# Patient Record
Sex: Female | Born: 1970 | Race: White | Hispanic: No | Marital: Married | Smoking: Former smoker
Health system: Southern US, Community
[De-identification: ages and names within clinical notes are randomized; demographics above are authoritative.]

## PROBLEM LIST (undated history)

## (undated) HISTORY — PX: CYSTOSCOPY WITH URETEROSCOPY AND STENT PLACEMENT: SHX6377

---

## 1998-07-11 ENCOUNTER — Ambulatory Visit (HOSPITAL_COMMUNITY): Admission: RE | Admit: 1998-07-11 | Discharge: 1998-07-11 | Payer: Self-pay | Admitting: Family Medicine

## 1998-07-11 ENCOUNTER — Encounter: Payer: Self-pay | Admitting: Family Medicine

## 1998-10-27 ENCOUNTER — Other Ambulatory Visit: Admission: RE | Admit: 1998-10-27 | Discharge: 1998-10-27 | Payer: Self-pay | Admitting: Obstetrics and Gynecology

## 1999-03-24 ENCOUNTER — Encounter: Payer: Self-pay | Admitting: Family Medicine

## 1999-03-24 ENCOUNTER — Ambulatory Visit (HOSPITAL_COMMUNITY): Admission: RE | Admit: 1999-03-24 | Discharge: 1999-03-24 | Payer: Self-pay | Admitting: Family Medicine

## 1999-03-28 ENCOUNTER — Ambulatory Visit (HOSPITAL_COMMUNITY): Admission: RE | Admit: 1999-03-28 | Discharge: 1999-03-28 | Payer: Self-pay

## 1999-03-28 ENCOUNTER — Encounter: Payer: Self-pay | Admitting: Family Medicine

## 1999-12-03 ENCOUNTER — Other Ambulatory Visit: Admission: RE | Admit: 1999-12-03 | Discharge: 1999-12-03 | Payer: Self-pay | Admitting: Obstetrics and Gynecology

## 1999-12-22 ENCOUNTER — Ambulatory Visit (HOSPITAL_COMMUNITY): Admission: RE | Admit: 1999-12-22 | Discharge: 1999-12-22 | Payer: Self-pay | Admitting: Internal Medicine

## 1999-12-22 ENCOUNTER — Encounter: Payer: Self-pay | Admitting: Internal Medicine

## 2000-05-03 ENCOUNTER — Encounter: Admission: RE | Admit: 2000-05-03 | Discharge: 2000-08-01 | Payer: Self-pay | Admitting: Emergency Medicine

## 2001-06-14 ENCOUNTER — Other Ambulatory Visit: Admission: RE | Admit: 2001-06-14 | Discharge: 2001-06-14 | Payer: Self-pay | Admitting: Obstetrics and Gynecology

## 2002-02-10 ENCOUNTER — Encounter: Payer: Self-pay | Admitting: Emergency Medicine

## 2002-02-10 ENCOUNTER — Emergency Department (HOSPITAL_COMMUNITY): Admission: EM | Admit: 2002-02-10 | Discharge: 2002-02-11 | Payer: Self-pay | Admitting: Emergency Medicine

## 2003-01-16 ENCOUNTER — Other Ambulatory Visit: Admission: RE | Admit: 2003-01-16 | Discharge: 2003-01-16 | Payer: Self-pay | Admitting: Obstetrics and Gynecology

## 2004-05-25 ENCOUNTER — Ambulatory Visit: Payer: Self-pay | Admitting: Adult Health

## 2004-11-19 ENCOUNTER — Emergency Department (HOSPITAL_COMMUNITY): Admission: EM | Admit: 2004-11-19 | Discharge: 2004-11-20 | Payer: Self-pay | Admitting: Emergency Medicine

## 2004-11-27 ENCOUNTER — Ambulatory Visit: Payer: Self-pay | Admitting: Adult Health

## 2005-07-30 ENCOUNTER — Ambulatory Visit: Payer: Self-pay | Admitting: Pulmonary Disease

## 2005-10-11 ENCOUNTER — Ambulatory Visit: Payer: Self-pay | Admitting: Pulmonary Disease

## 2005-10-25 ENCOUNTER — Ambulatory Visit: Payer: Self-pay | Admitting: Pulmonary Disease

## 2005-11-12 ENCOUNTER — Ambulatory Visit: Payer: Self-pay | Admitting: Pulmonary Disease

## 2006-01-24 ENCOUNTER — Encounter: Payer: Self-pay | Admitting: Cardiology

## 2006-01-24 ENCOUNTER — Ambulatory Visit: Payer: Self-pay | Admitting: Cardiovascular Disease

## 2006-01-24 ENCOUNTER — Ambulatory Visit: Payer: Self-pay

## 2006-07-12 ENCOUNTER — Inpatient Hospital Stay (HOSPITAL_COMMUNITY): Admission: RE | Admit: 2006-07-12 | Discharge: 2006-07-14 | Payer: Self-pay | Admitting: Obstetrics and Gynecology

## 2006-07-12 ENCOUNTER — Encounter (INDEPENDENT_AMBULATORY_CARE_PROVIDER_SITE_OTHER): Payer: Self-pay | Admitting: *Deleted

## 2006-07-20 ENCOUNTER — Ambulatory Visit (HOSPITAL_COMMUNITY): Admission: RE | Admit: 2006-07-20 | Discharge: 2006-07-20 | Payer: Self-pay | Admitting: Obstetrics and Gynecology

## 2006-11-08 ENCOUNTER — Ambulatory Visit: Payer: Self-pay | Admitting: Pulmonary Disease

## 2007-02-10 ENCOUNTER — Ambulatory Visit: Payer: Self-pay | Admitting: Internal Medicine

## 2007-02-10 LAB — CONVERTED CEMR LAB
Basophils Absolute: 0.1 10*3/uL (ref 0.0–0.1)
Basophils Relative: 0.8 % (ref 0.0–1.0)
Eosinophils Relative: 1.4 % (ref 0.0–5.0)
HCT: 37.4 % (ref 36.0–46.0)
Hemoglobin: 12.7 g/dL (ref 12.0–15.0)
MCHC: 34 g/dL (ref 30.0–36.0)
MCV: 83.8 fL (ref 78.0–100.0)
Platelets: 539 10*3/uL — ABNORMAL HIGH (ref 150–400)
RBC: 4.46 M/uL (ref 3.87–5.11)
RDW: 12 % (ref 11.5–14.6)

## 2007-02-14 ENCOUNTER — Ambulatory Visit: Payer: Self-pay | Admitting: Internal Medicine

## 2007-02-14 ENCOUNTER — Encounter: Payer: Self-pay | Admitting: Internal Medicine

## 2007-08-10 ENCOUNTER — Telehealth (INDEPENDENT_AMBULATORY_CARE_PROVIDER_SITE_OTHER): Payer: Self-pay | Admitting: *Deleted

## 2007-08-10 DIAGNOSIS — F988 Other specified behavioral and emotional disorders with onset usually occurring in childhood and adolescence: Secondary | ICD-10-CM | POA: Insufficient documentation

## 2007-08-10 DIAGNOSIS — K589 Irritable bowel syndrome without diarrhea: Secondary | ICD-10-CM | POA: Insufficient documentation

## 2007-08-11 ENCOUNTER — Ambulatory Visit: Payer: Self-pay | Admitting: Pulmonary Disease

## 2007-08-11 DIAGNOSIS — J069 Acute upper respiratory infection, unspecified: Secondary | ICD-10-CM | POA: Insufficient documentation

## 2007-08-11 DIAGNOSIS — J019 Acute sinusitis, unspecified: Secondary | ICD-10-CM

## 2007-08-12 DIAGNOSIS — F329 Major depressive disorder, single episode, unspecified: Secondary | ICD-10-CM

## 2007-08-12 DIAGNOSIS — R0789 Other chest pain: Secondary | ICD-10-CM | POA: Insufficient documentation

## 2007-08-12 DIAGNOSIS — N200 Calculus of kidney: Secondary | ICD-10-CM | POA: Insufficient documentation

## 2007-08-12 DIAGNOSIS — IMO0002 Reserved for concepts with insufficient information to code with codable children: Secondary | ICD-10-CM | POA: Insufficient documentation

## 2007-09-08 ENCOUNTER — Telehealth (INDEPENDENT_AMBULATORY_CARE_PROVIDER_SITE_OTHER): Payer: Self-pay | Admitting: *Deleted

## 2007-10-03 ENCOUNTER — Encounter: Payer: Self-pay | Admitting: Pulmonary Disease

## 2007-10-04 DIAGNOSIS — D126 Benign neoplasm of colon, unspecified: Secondary | ICD-10-CM

## 2007-10-04 DIAGNOSIS — K5900 Constipation, unspecified: Secondary | ICD-10-CM | POA: Insufficient documentation

## 2007-11-27 ENCOUNTER — Telehealth (INDEPENDENT_AMBULATORY_CARE_PROVIDER_SITE_OTHER): Payer: Self-pay | Admitting: *Deleted

## 2007-11-28 ENCOUNTER — Telehealth (INDEPENDENT_AMBULATORY_CARE_PROVIDER_SITE_OTHER): Payer: Self-pay | Admitting: *Deleted

## 2008-01-23 ENCOUNTER — Ambulatory Visit: Payer: Self-pay | Admitting: Internal Medicine

## 2008-01-23 DIAGNOSIS — R51 Headache: Secondary | ICD-10-CM

## 2008-01-23 DIAGNOSIS — R519 Headache, unspecified: Secondary | ICD-10-CM | POA: Insufficient documentation

## 2008-01-24 LAB — CONVERTED CEMR LAB
BUN: 14 mg/dL (ref 6–23)
Basophils Absolute: 0 10*3/uL (ref 0.0–0.1)
CO2: 25 meq/L (ref 19–32)
Calcium: 8.9 mg/dL (ref 8.4–10.5)
Eosinophils Relative: 1.6 % (ref 0.0–5.0)
Glucose, Bld: 86 mg/dL (ref 70–99)
MCHC: 35.4 g/dL (ref 30.0–36.0)
Monocytes Absolute: 0.4 10*3/uL (ref 0.1–1.0)
Monocytes Relative: 3.8 % (ref 3.0–12.0)
Neutro Abs: 8.1 10*3/uL — ABNORMAL HIGH (ref 1.4–7.7)
Neutrophils Relative %: 78.6 % — ABNORMAL HIGH (ref 43.0–77.0)
RDW: 13.3 % (ref 11.5–14.6)
Sed Rate: 14 mm/hr (ref 0–22)
Sodium: 138 meq/L (ref 135–145)
TSH: 1.59 microintl units/mL (ref 0.35–5.50)

## 2008-01-29 ENCOUNTER — Telehealth (INDEPENDENT_AMBULATORY_CARE_PROVIDER_SITE_OTHER): Payer: Self-pay | Admitting: *Deleted

## 2009-03-08 ENCOUNTER — Observation Stay (HOSPITAL_COMMUNITY): Admission: EM | Admit: 2009-03-08 | Discharge: 2009-03-08 | Payer: Self-pay | Admitting: Emergency Medicine

## 2009-03-10 ENCOUNTER — Observation Stay (HOSPITAL_COMMUNITY): Admission: EM | Admit: 2009-03-10 | Discharge: 2009-03-13 | Payer: Self-pay | Admitting: Emergency Medicine

## 2009-03-19 ENCOUNTER — Inpatient Hospital Stay (HOSPITAL_COMMUNITY): Admission: AD | Admit: 2009-03-19 | Discharge: 2009-03-22 | Payer: Self-pay | Admitting: Urology

## 2010-09-25 IMAGING — CT CT ABDOMEN W/ CM
2 of 5 series · 13 of 32 positions shown, 18 images · IV contrast (water/omni  & 100 ML OMNI 300)
Comparison: CT scan of the abdomen 11/20/2004

CT ABDOMEN

CLINICAL DATA: Abdominal pain.  The left flank pain.  Kidney
infection.

CT ABDOMEN AND PELVIS WITH CONTRAST
TECHNIQUE: Multidetector CT imaging of the abdomen and pelvis was
performed using the standard protocol following bolus
administration of intravenous contrast.
Contrast: 100 ml Omnipaque 300

[Series 2: routine abdomen · axial · 0.70mm/px · z∈[-433,-108]mm · 7 of 87 slices shown, 12 images]
[im 11/87  soft-tissue]
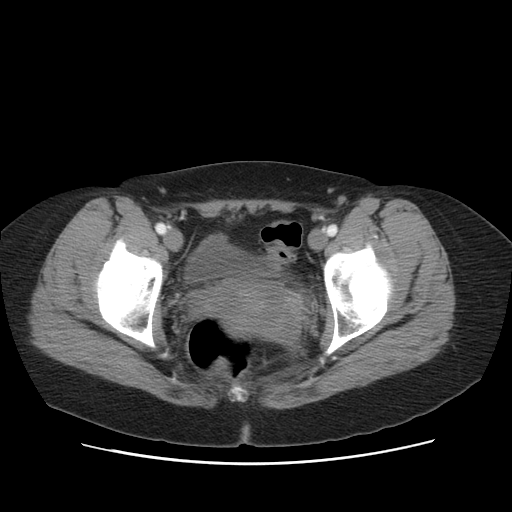
[im 11/87  bone]
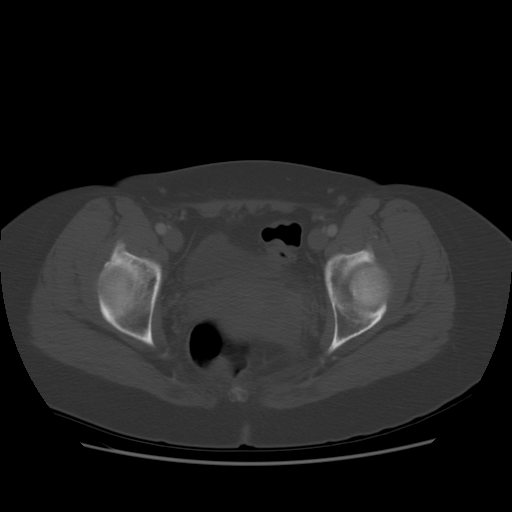
[im 22/87  soft-tissue]
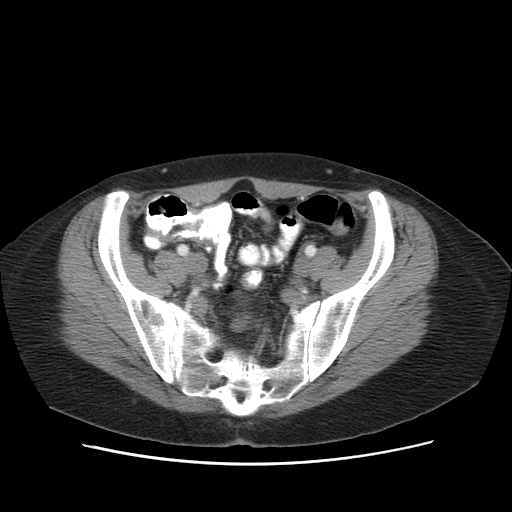
[im 33/87  soft-tissue]
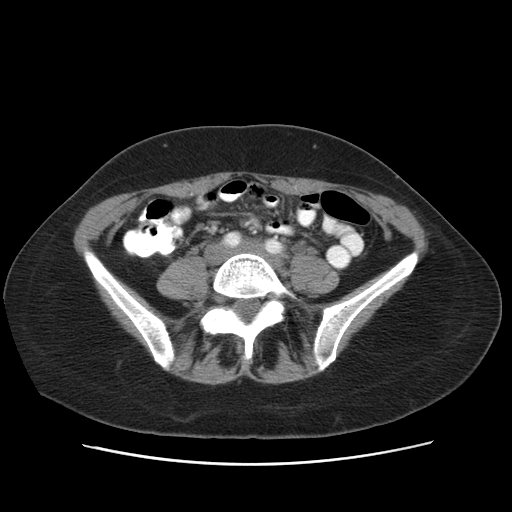
[im 44/87  soft-tissue]
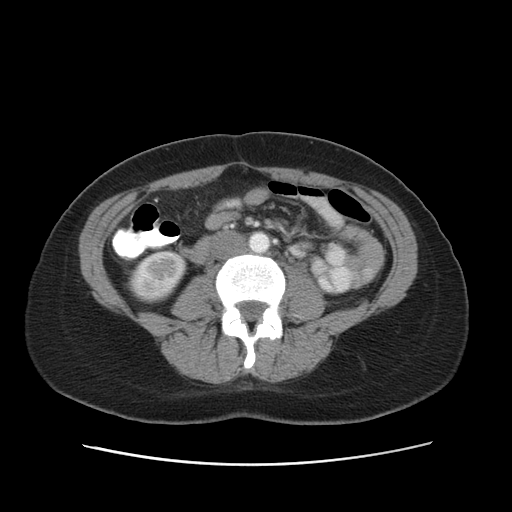
[im 44/87  lung]
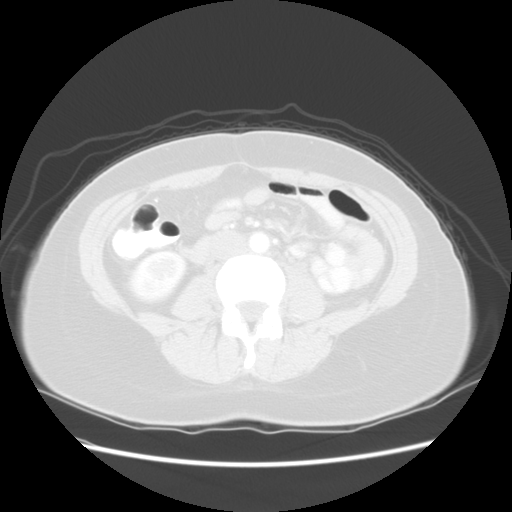
[im 54/87  soft-tissue]
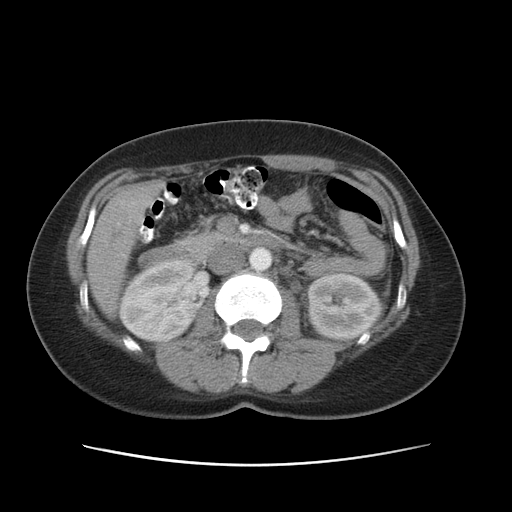
[im 54/87  lung]
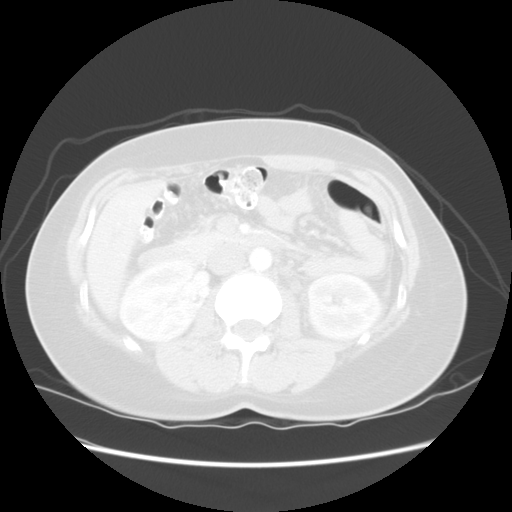
[im 65/87  soft-tissue]
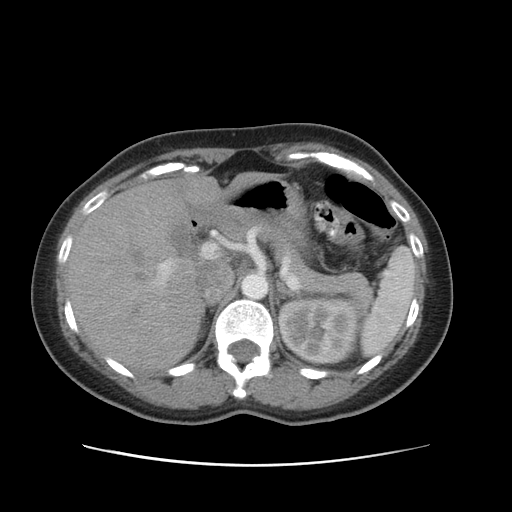
[im 65/87  lung]
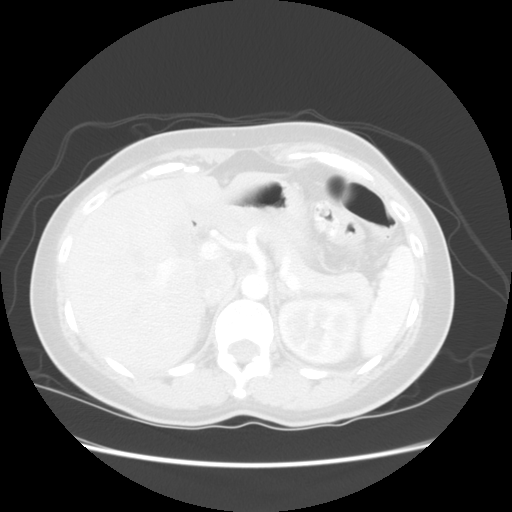
[im 76/87  soft-tissue]
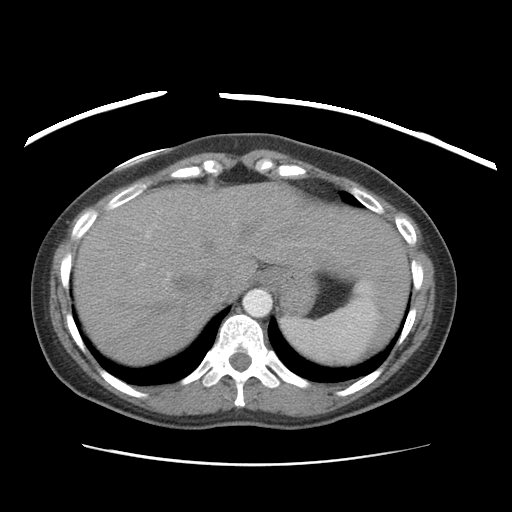
[im 76/87  lung]
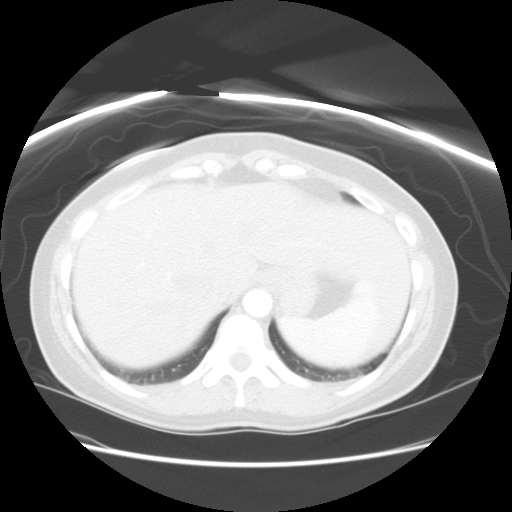

[Series 400: reformatted · sagittal · 0.93mm/px · 6 of 83 slices shown]
[im 11/83  soft-tissue]
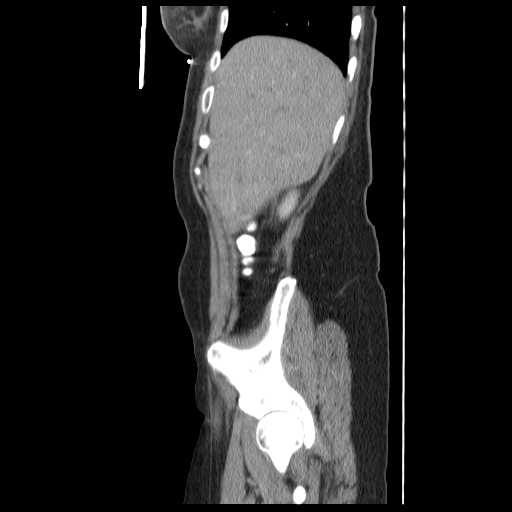
[im 21/83  soft-tissue]
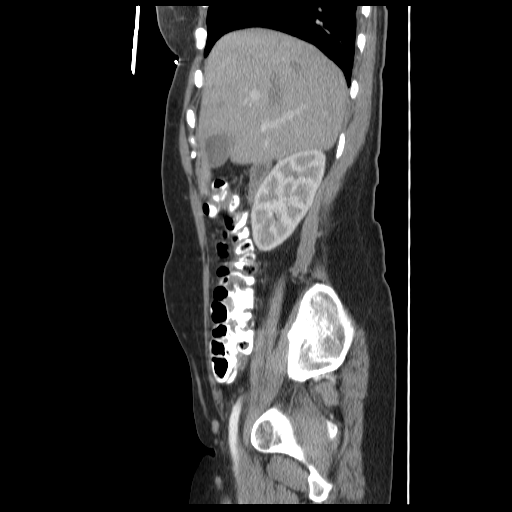
[im 31/83  soft-tissue]
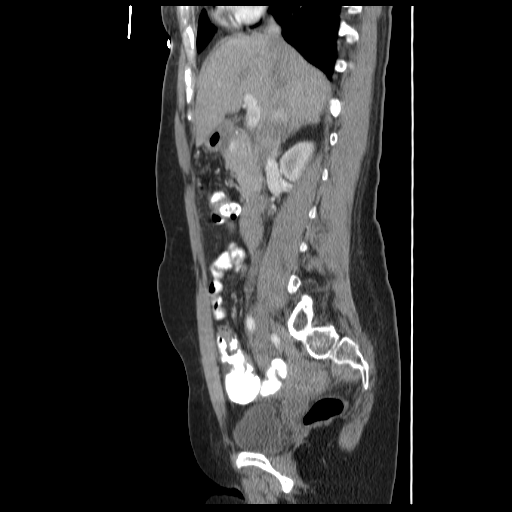
[im 42/83  soft-tissue]
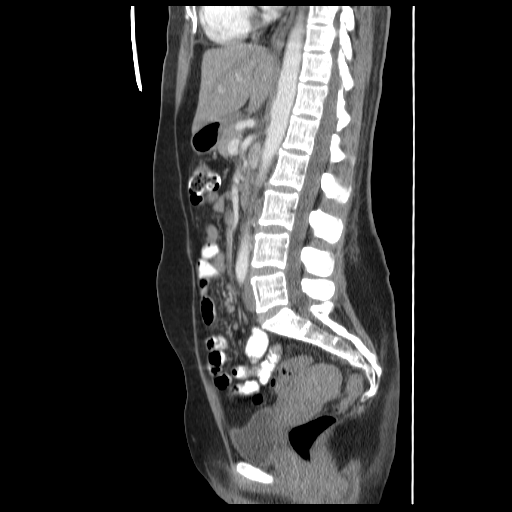
[im 52/83  soft-tissue]
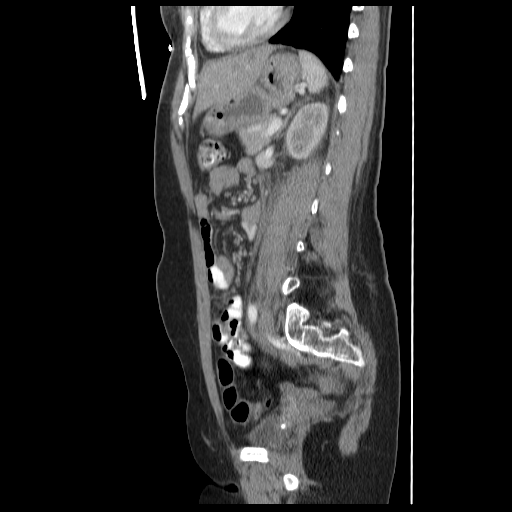
[im 62/83  soft-tissue]
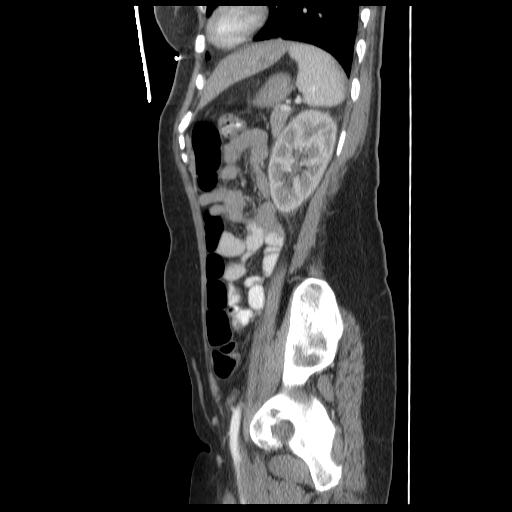

[13 of 32 positions shown; findings below may reference images not displayed]

FINDINGS: There is minimal atelectasis at the lung bases
bilaterally.  Heart size is normal.  There is no significant
pleural or pericardial effusion.

The liver and spleen are within normal limits.  The stomach,
pancreas, and duodenum are normal.  The common bile duct to and
gallbladder are within normal limits.  The adrenal glands are
normal bilaterally.  The right kidney is unremarkable.  There is
some stranding and slight delay in contrast opacification of the
left kidney.  These findings are compatible with pyelonephritis.
The right kidney is unremarkable.  There are small left para-aortic
lymph nodes, likely reactive in nature.  The small bowel is
unremarkable.  Bone windows reveal no focal lytic or blastic
lesions.
IMPRESSION: 1.  Mild inflammatory changes and slight delay in contrast
enhancement of the left kidney is compatible with pyelonephritis.
2.  No complicating features such as abscess.

CT PELVIS
FINDINGS: The left ureter is mildly dilated throughout its course
in the abdomen and pelvis.  The right ureter is unremarkable.  The
uterus and adnexa are within normal limits.  Diverticular changes
are present within the sigmoid colon.  No focal inflammation is
present to suggest diverticulitis.  The remainder of the colon is
within normal limits.  The appendix is not clearly visualized.  No
secondary signs of inflammation are evident.  The urinary bladder
is within normal limits.  There is no significant pelvic adenopathy
or free fluid.
IMPRESSION: 1.  Sigmoid diverticulosis without diverticulitis.
2.  Mild dilation of the left ureter is compatible with
inflammation.

3.  No other acute abnormality of the pelvis.

## 2010-10-02 LAB — BASIC METABOLIC PANEL
BUN: 3 mg/dL — ABNORMAL LOW (ref 6–23)
BUN: <1 mg/dL — ABNORMAL LOW (ref 6–23)
CO2: 26 mEq/L (ref 19–32)
CO2: 26 mEq/L (ref 19–32)
CO2: 28 mEq/L (ref 19–32)
Calcium: 8.7 mg/dL (ref 8.4–10.5)
Calcium: 8.7 mg/dL (ref 8.4–10.5)
Calcium: 8.2 mg/dL — ABNORMAL LOW (ref 8.4–10.5)
Chloride: 104 mEq/L (ref 96–112)
GFR calc Af Amer: >60 mL/min (ref >60)
GFR calc non Af Amer: >60 mL/min (ref >60)
GFR calc non Af Amer: >60 mL/min (ref >60)
Glucose, Bld: 95 mg/dL (ref 70–99)
Glucose, Bld: 87 mg/dL (ref 70–99)
Glucose, Bld: 108 mg/dL — ABNORMAL HIGH (ref 70–99)
Glucose, Bld: 91 mg/dL (ref 70–99)
Potassium: 3.5 mEq/L (ref 3.5–5.1)
Potassium: 3.7 mEq/L (ref 3.5–5.1)
Potassium: 3.2 mEq/L — ABNORMAL LOW (ref 3.5–5.1)
Sodium: 135 mEq/L (ref 135–145)
Sodium: 140 mEq/L (ref 135–145)

## 2010-10-02 LAB — CBC
HCT: 40.3 % (ref 36.0–46.0)
HCT: 41.1 % (ref 36.0–46.0)
HCT: 37.3 % (ref 36.0–46.0)
HCT: 36.9 % (ref 36.0–46.0)
HCT: 38.6 % (ref 36.0–46.0)
Hemoglobin: 13.8 g/dL (ref 12.0–15.0)
Hemoglobin: 12.6 g/dL (ref 12.0–15.0)
Hemoglobin: 12.6 g/dL (ref 12.0–15.0)
Hemoglobin: 12.9 g/dL (ref 12.0–15.0)
MCHC: 34.3 g/dL (ref 30.0–36.0)
MCHC: 33.8 g/dL (ref 30.0–36.0)
MCHC: 34.1 g/dL (ref 30.0–36.0)
MCHC: 34.3 g/dL (ref 30.0–36.0)
MCV: 90.6 fL (ref 78.0–100.0)
MCV: 90.9 fL (ref 78.0–100.0)
MCV: 91.4 fL (ref 78.0–100.0)
Platelets: 377 10*3/uL (ref 150–400)
Platelets: 387 10*3/uL (ref 150–400)
Platelets: 357 10*3/uL (ref 150–400)
Platelets: 306 10*3/uL (ref 150–400)
Platelets: 350 K/uL (ref 150–400)
Platelets: 402 10*3/uL — ABNORMAL HIGH (ref 150–400)
RBC: 4.45 MIL/uL (ref 3.87–5.11)
RBC: 4.11 MIL/uL (ref 3.87–5.11)
RBC: 4.03 MIL/uL (ref 3.87–5.11)
RDW: 13.1 % (ref 11.5–15.5)
RDW: 13.4 % (ref 11.5–15.5)
RDW: 13.5 % (ref 11.5–15.5)
RDW: 13.6 % (ref 11.5–15.5)
RDW: 13.8 % (ref 11.5–15.5)
WBC: 7.2 10*3/uL (ref 4.0–10.5)
WBC: 4.5 10*3/uL (ref 4.0–10.5)
WBC: 5.9 K/uL (ref 4.0–10.5)

## 2010-10-02 LAB — POCT I-STAT, CHEM 8
BUN: 19 mg/dL (ref 6–23)
Calcium, Ion: 1.08 mmol/L — ABNORMAL LOW (ref 1.12–1.32)
Chloride: 106 meq/L (ref 96–112)
Creatinine, Ser: 0.8 mg/dL (ref 0.4–1.2)
Glucose, Bld: 116 mg/dL — ABNORMAL HIGH (ref 70–99)
HCT: 46 % (ref 36.0–46.0)
Hemoglobin: 15.6 g/dL — ABNORMAL HIGH (ref 12.0–15.0)
Potassium: 3.8 meq/L (ref 3.5–5.1)
Sodium: 139 meq/L (ref 135–145)
TCO2: 24 mmol/L (ref 0–100)

## 2010-10-02 LAB — DIFFERENTIAL
Eosinophils Relative: 1 % (ref 0–5)
Monocytes Absolute: 0.6 10*3/uL (ref 0.1–1.0)
Neutro Abs: 7.9 10*3/uL — ABNORMAL HIGH (ref 1.7–7.7)
Neutrophils Relative %: 77 % (ref 43–77)

## 2010-10-02 LAB — COMPREHENSIVE METABOLIC PANEL WITH GFR
ALT: 10 U/L (ref 0–35)
AST: 19 U/L (ref 0–37)
Albumin: 3.8 g/dL (ref 3.5–5.2)
Alkaline Phosphatase: 42 U/L (ref 39–117)
BUN: 9 mg/dL (ref 6–23)
CO2: 25 meq/L (ref 19–32)
Calcium: 8.7 mg/dL (ref 8.4–10.5)
Chloride: 105 meq/L (ref 96–112)
Creatinine, Ser: 0.8 mg/dL (ref 0.4–1.2)
GFR calc non Af Amer: >60 mL/min
Glucose, Bld: 94 mg/dL (ref 70–99)
Potassium: 3.5 meq/L (ref 3.5–5.1)
Sodium: 140 meq/L (ref 135–145)
Total Bilirubin: 0.8 mg/dL (ref 0.3–1.2)
Total Protein: 6.5 g/dL (ref 6.0–8.3)

## 2010-10-02 LAB — URINE MICROSCOPIC-ADD ON

## 2010-10-02 LAB — URINALYSIS, ROUTINE W REFLEX MICROSCOPIC
Bilirubin Urine: NEGATIVE
Glucose, UA: NEGATIVE mg/dL
Glucose, UA: NEGATIVE mg/dL
Ketones, ur: 40 mg/dL — AB
Nitrite: NEGATIVE
Nitrite: POSITIVE — AB
Protein, ur: 100 mg/dL — AB
Protein, ur: NEGATIVE mg/dL
Specific Gravity, Urine: 1.008 (ref 1.005–1.030)
Specific Gravity, Urine: 1.025 (ref 1.005–1.030)
Urobilinogen, UA: 1 mg/dL (ref 0.0–1.0)
pH: 7 (ref 5.0–8.0)
pH: 8 (ref 5.0–8.0)

## 2010-10-02 LAB — POCT PREGNANCY, URINE
Preg Test, Ur: NEGATIVE
Preg Test, Ur: NEGATIVE

## 2010-10-02 LAB — CULTURE, BLOOD (ROUTINE X 2): Culture: NO GROWTH

## 2010-10-02 LAB — URINE CULTURE
Colony Count: NO GROWTH
Culture: NO GROWTH

## 2010-10-02 LAB — CALCIUM: Calcium: 8.5 mg/dL (ref 8.4–10.5)

## 2010-10-02 LAB — WET PREP, GENITAL
Trich, Wet Prep: NONE SEEN
Yeast Wet Prep HPF POC: NONE SEEN

## 2010-10-02 LAB — GC/CHLAMYDIA PROBE AMP, GENITAL: GC Probe Amp, Genital: NEGATIVE

## 2010-10-02 LAB — LIPASE, BLOOD: Lipase: 15 U/L (ref 11–59)
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
## 2010-11-13 NOTE — Op Note (Signed)
[name], [name]            ACCOUNT NO.:  [id number]   MEDICAL RECORD NO.:  [id number]          PATIENT TYPE:  INP   LOCATION:  9167                          FACILITY:  [facility]   PHYSICIAN:  Richardean Sale, M.D.   DATE OF BIRTH:  10-26-1970   DATE OF PROCEDURE:  07/12/2006  DATE OF DISCHARGE:                               OPERATIVE REPORT   PREOPERATIVE DIAGNOSIS:  Postpartum bleeding, uterine atony, suspected  retained placenta.   POSTOPERATIVE DIAGNOSIS:  Postpartum bleeding, uterine atony, and  retained placenta.   PROCEDURE:  Dilation and curettage.   SURGEON:  Richardean Sale, M.D.   ANESTHESIA:  Epidural.   COMPLICATIONS:  None.   ESTIMATED BLOOD LOSS:  500 mL.   DRAINS:  30 mL Foley catheter placed.   OPERATIVE FINDINGS:  Multiple small placental fragments in fundus, no  cervical lacerations or vaginal lacerations, second degree perineal  laceration repaired at the time vaginal delivery, superficial  periurethral laceration that was hemostatic.   INDICATIONS:  This is a 40 year old white female gravida 1, para 0, who  underwent induction of labor at 51 1/2 weeks for post dates on July 12, 2006.  The patient made excellent progress in labor and had  spontaneous vaginal delivery of a baby boy.  The placenta was delivered  spontaneously and appeared intact. After delivery of the placenta, there  was a large amount of uterine bleeding.  Bimanual exam was performed  with multiple large clots removed.  The patient received Pitocin 20  units intravenously as well as Methergine 0.2 mg IM.  The bleeding  improved temporarily but resumed despite the Methergine.  She  subsequently had Cytotec 600 mcg placed per rectum with some improvement  in the uterine tone.  At this time, the second degree perineal  laceration was repaired with 3-0 Vicryl and was hemostatic.  The patient  continued to have a moderate amount of bleeding and passing clots that  were approximately  3-4 cm in diameter. Bimanual exam was performed.  There were scant placental membrane fragments removed manually.  The  patient was very uncomfortable and was unable to tolerate any further  manipulation of the uterus despite intravenous Stadol, and given her  persistent bleeding, I recommended proceeding to the operating room for  D&C to remove any additional tissue.  The risks and benefits of the  procedure were reviewed with the patient before going to the OR.  All  questions were answered.  Informed informed was obtained.   DESCRIPTION OF PROCEDURE:  The patient was taken to the operating room  where her epidural was redosed and tested and once anesthesia was  adequate, she was prepped and draped in the dorsal lithotomy position  with Betadine.  A red rubber catheter was used to drain the bladder.  There was a steady moderate amount of bleeding.  A speculum was placed  in the vagina and clot was removed.  The cervix was grasped with ring  forceps and circumferentially inspected.  There were no cervical  lacerations. Bimanual exam was performed. Again, multiple clots were  removed.  The banjo curet was then  used to curet the uterus with removal  of a small amount of placental membranes. The patient continued to have  a study amount of bleeding.  She received another dose of Methergine in  the OR as well as a dose of Hemabate.  After all palpable placental  tissue was removed, the patient's bleeding slowed. Given the heavy  prolonged bleeding, I placed a 30 mL Foley catheter in the uterus to  help tamponade the uterus to prevent any further blood loss. The  patient's bleeding was now minimal.  The perineum was inspected.  The  periurethral laceration was hemostatic.  The second degree perineal  repair had been disrupted during the procedure and this was repaired  again.  The perineal laceration was hemostatic. At this point, the  procedure was terminated.  She was taken out of the  dorsal lithotomy  position and was transferred to the recovery room.  Her epidural will be  left in place in the event that further surgical intervention is needed.  The Foley catheter in the uterus will remain overnight and she received  500 mg of IV Flagyl as prophylaxis. Estimated blood loss for the vaginal  delivery was approximately 500 mL and also another 500 mL in the OR.  CBC and coagulation studies are pending at this time.  All sponge, lap,  needle and instrument counts were correct x2.  The patient remained  stable throughout the procedure and was taken to the recovery room awake  in stable condition.      Richardean Sale, M.D.  Electronically Signed     JW/MEDQ  D:  07/12/2006  T:  07/12/2006  Job:  [id number]

## 2010-11-13 NOTE — Assessment & Plan Note (Signed)
[facility]                              CARDIOLOGY OFFICE NOTE   NAME:[name], [name]                   MRN:          [id number]  DATE:01/24/2006                            DOB:          03/08/1971    Geoffrey is seen today as a new consult.  She is a former employee here.  She is a friend and well known to our clinic.   Bruchy is about [redacted] weeks pregnant.  She has been having some substernal  chest pain that has been going on for a couple of days.  It is minimally  pleuritic in nature.  It sounds musculoskeletal.  She does not have a  history of congenital heart problems or previous heart abnormalities.   PAST MEDICAL HISTORY:  Remarkable for previous anorexia.   She has not had any previous cardiac problems, palpitations, PND, orthopnea  or lower extremity edema.   She was sent over by her OB-GYN doctor.   Her pregnancy has been fairly uncomplicated.   REVIEW OF SYSTEMS:  On review of systems she has had no significant nausea  since her first trimester.   Her blood pressure has been good.  There has been no evidence of abnormal  proteinuria.   MEDICATIONS:  She has been taking Tylenol and no other medications.   She is due to have an ultrasound of the baby next week.   EXAMINATION:  GENERAL:  She looks well.  VITAL SIGNS:  Blood pressure is 128/80.  Pulse 76 and regular.  LUNGS:  Clear. She has mild tenderness to palpation over the sternum.  HEART:  There is an S1-S2 without prolapse, murmur, rub or gallop.  ABDOMEN:  Benign.  EXTREMITIES:  Lower extremities intact pulses.  No edema.   EKG:  Normal.   IMPRESSION:  Atypical chest pain most likely musculoskeletal in nature.  I  will call her OB-GYN doctor to see if she can take non-steroidal's.   We will do TB, echocardiogram in the office today and I will see her back as  needed.                               Noralyn Pick. Eden Emms, MD, [facility]    PCN/MedQ  DD:  01/24/2006  DT:   01/24/2006  Job #:  [id number]

## 2011-03-03 ENCOUNTER — Encounter (HOSPITAL_COMMUNITY): Payer: Self-pay

## 2011-03-05 ENCOUNTER — Ambulatory Visit (HOSPITAL_COMMUNITY)
Admission: RE | Admit: 2011-03-05 | Discharge: 2011-03-05 | Disposition: A | Discharge status: Home / Self Care | Payer: 59 | Source: Ambulatory Visit | Attending: Internal Medicine | Admitting: Internal Medicine

## 2011-03-05 DIAGNOSIS — J40 Bronchitis, not specified as acute or chronic: Secondary | ICD-10-CM | POA: Insufficient documentation

## 2011-03-05 DIAGNOSIS — R059 Cough, unspecified: Secondary | ICD-10-CM | POA: Insufficient documentation

## 2011-03-05 DIAGNOSIS — R0609 Other forms of dyspnea: Secondary | ICD-10-CM | POA: Insufficient documentation

## 2011-03-05 DIAGNOSIS — J988 Other specified respiratory disorders: Secondary | ICD-10-CM | POA: Insufficient documentation

## 2011-03-05 DIAGNOSIS — R062 Wheezing: Secondary | ICD-10-CM | POA: Insufficient documentation

## 2011-03-05 DIAGNOSIS — R0989 Other specified symptoms and signs involving the circulatory and respiratory systems: Secondary | ICD-10-CM | POA: Insufficient documentation

## 2011-03-05 DIAGNOSIS — R05 Cough: Secondary | ICD-10-CM | POA: Insufficient documentation

## 2011-09-07 ENCOUNTER — Other Ambulatory Visit: Payer: Self-pay | Admitting: Obstetrics and Gynecology

## 2011-09-07 DIAGNOSIS — Z1231 Encounter for screening mammogram for malignant neoplasm of breast: Secondary | ICD-10-CM

## 2011-10-08 ENCOUNTER — Ambulatory Visit
Admission: RE | Admit: 2011-10-08 | Discharge: 2011-10-08 | Disposition: A | Discharge status: Home / Self Care | Payer: 59 | Source: Ambulatory Visit | Attending: Obstetrics and Gynecology | Admitting: Obstetrics and Gynecology

## 2011-10-08 DIAGNOSIS — Z1231 Encounter for screening mammogram for malignant neoplasm of breast: Secondary | ICD-10-CM

## 2011-11-16 ENCOUNTER — Encounter: Payer: Self-pay | Admitting: Internal Medicine

## 2012-10-05 ENCOUNTER — Encounter: Payer: Self-pay | Admitting: Internal Medicine

## 2015-01-23 ENCOUNTER — Emergency Department (HOSPITAL_COMMUNITY)
Admission: EM | Admit: 2015-01-23 | Discharge: 2015-01-23 | Disposition: A | Discharge status: Home / Self Care | Payer: Self-pay | Attending: Emergency Medicine | Admitting: Emergency Medicine

## 2015-01-23 ENCOUNTER — Encounter (HOSPITAL_COMMUNITY): Payer: Self-pay

## 2015-01-23 DIAGNOSIS — R112 Nausea with vomiting, unspecified: Secondary | ICD-10-CM | POA: Insufficient documentation

## 2015-01-23 DIAGNOSIS — K047 Periapical abscess without sinus: Secondary | ICD-10-CM | POA: Insufficient documentation

## 2015-01-23 DIAGNOSIS — Z87891 Personal history of nicotine dependence: Secondary | ICD-10-CM | POA: Insufficient documentation

## 2015-01-23 LAB — I-STAT CHEM 8, ED
BUN: 25 mg/dL — ABNORMAL HIGH (ref 6–20)
CALCIUM ION: 1.16 mmol/L (ref 1.12–1.23)
CHLORIDE: 102 mmol/L (ref 101–111)
CREATININE: 0.7 mg/dL (ref 0.44–1.00)
GLUCOSE: 104 mg/dL — AB (ref 65–99)
HCT: 44 % (ref 36.0–46.0)
Hemoglobin: 15 g/dL (ref 12.0–15.0)
Potassium: 3.7 mmol/L (ref 3.5–5.1)
SODIUM: 139 mmol/L (ref 135–145)
TCO2: 25 mmol/L (ref 0–100)

## 2015-01-23 LAB — CBC WITH DIFFERENTIAL/PLATELET
BASOS PCT: 0 % (ref 0–1)
Basophils Absolute: 0 10*3/uL (ref 0.0–0.1)
Eosinophils Absolute: 0.1 10*3/uL (ref 0.0–0.7)
Eosinophils Relative: 0 % (ref 0–5)
HCT: 41.3 % (ref 36.0–46.0)
HEMOGLOBIN: 13.7 g/dL (ref 12.0–15.0)
LYMPHS ABS: 1.5 10*3/uL (ref 0.7–4.0)
LYMPHS PCT: 13 % (ref 12–46)
MCH: 29.3 pg (ref 26.0–34.0)
MCHC: 33.2 g/dL (ref 30.0–36.0)
MCV: 88.2 fL (ref 78.0–100.0)
MONO ABS: 0.8 10*3/uL (ref 0.1–1.0)
MONOS PCT: 7 % (ref 3–12)
NEUTROS PCT: 80 % — AB (ref 43–77)
Neutro Abs: 9.3 10*3/uL — ABNORMAL HIGH (ref 1.7–7.7)
PLATELETS: 371 10*3/uL (ref 150–400)
RBC: 4.68 MIL/uL (ref 3.87–5.11)
RDW: 13.3 % (ref 11.5–15.5)
WBC: 11.7 10*3/uL — AB (ref 4.0–10.5)

## 2015-01-23 MED ORDER — OXYCODONE-ACETAMINOPHEN 5-325 MG PO TABS: 1.0000 | ORAL_TABLET | Freq: Four times a day (QID) | ORAL | Status: AC | PRN

## 2015-01-23 MED ORDER — AMOXICILLIN-POT CLAVULANATE 875-125 MG PO TABS
1.0000 | ORAL_TABLET | Freq: Once | ORAL | Status: AC
  Administered 2015-01-23: 1 via ORAL
  Filled 2015-01-23: qty 1

## 2015-01-23 MED ORDER — MORPHINE SULFATE 4 MG/ML IJ SOLN
6.0000 mg | Freq: Once | INTRAMUSCULAR | Status: AC
  Administered 2015-01-23: 6 mg via INTRAVENOUS
  Filled 2015-01-23: qty 2

## 2015-01-23 MED ORDER — SODIUM CHLORIDE 0.9 % IV BOLUS (SEPSIS)
2000.0000 mL | Freq: Once | INTRAVENOUS | Status: AC
  Administered 2015-01-23: 2000 mL via INTRAVENOUS

## 2015-01-23 MED ORDER — ONDANSETRON HCL 4 MG/2ML IJ SOLN
INTRAMUSCULAR | Status: AC
  Administered 2015-01-23: 13:00:00
  Filled 2015-01-23: qty 2

## 2015-01-23 MED ORDER — ONDANSETRON HCL 8 MG PO TABS: 8.0000 mg | ORAL_TABLET | Freq: Three times a day (TID) | ORAL | Status: AC | PRN

## 2015-01-23 MED ORDER — ONDANSETRON HCL 4 MG/2ML IJ SOLN
4.0000 mg | Freq: Once | INTRAMUSCULAR | Status: AC
Start: 2015-01-23 — End: 2015-01-23
  Administered 2015-01-23: 4 mg via INTRAVENOUS

## 2015-01-23 NOTE — Discharge Instructions (Signed)
Dental Abscess Make sure that you drink at least six 8 ounce glasses of water or Gatorade each day. Take Tylenol for mild pain or the pain medicine prescribed for bad pain. Don't take the pain medicine prescribed together with Tylenol, as the combination can be dangerous. Discontinue Toradol. Keep your scheduled appointment with Dr.Mohorn at 2 PM today. Get your blood pressure recheck within the next 3 weeks. Today's was elevated at 163/95. A dental abscess is a collection of infected fluid (pus) from a bacterial infection in the inner part of the tooth (pulp). It usually occurs at the end of the tooth's root.  CAUSES   Severe tooth decay.  Trauma to the tooth that allows bacteria to enter into the pulp, such as a broken or chipped tooth. SYMPTOMS   Severe pain in and around the infected tooth.  Swelling and redness around the abscessed tooth or in the mouth or face.  Tenderness.  Pus drainage.  Bad breath.  Bitter taste in the mouth.  Difficulty swallowing.  Difficulty opening the mouth.  Nausea.  Vomiting.  Chills.  Swollen neck glands. DIAGNOSIS   A medical and dental history will be taken.  An examination will be performed by tapping on the abscessed tooth.  X-rays may be taken of the tooth to identify the abscess. TREATMENT The goal of treatment is to eliminate the infection. You may be prescribed antibiotic medicine to stop the infection from spreading. A root canal may be performed to save the tooth. If the tooth cannot be saved, it may be pulled (extracted) and the abscess may be drained.  HOME CARE INSTRUCTIONS  Only take over-the-counter or prescription medicines for pain, fever, or discomfort as directed by your caregiver.  Rinse your mouth (gargle) often with salt water ( tsp salt in 8 oz [250 ml] of warm water) to relieve pain or swelling.  Do not drive after taking pain medicine (narcotics).  Do not apply heat to the outside of your face.  Return to  your dentist for further treatment as directed. SEEK MEDICAL CARE IF:  Your pain is not helped by medicine.  Your pain is getting worse instead of better. SEEK IMMEDIATE MEDICAL CARE IF:  You have a fever or persistent symptoms for more than 2-3 days.  You have a fever and your symptoms suddenly get worse.  You have chills or a very bad headache.  You have problems breathing or swallowing.  You have trouble opening your mouth.  You have swelling in the neck or around the eye. Document Released: 06/14/2005 Document Revised: 03/08/2012 Document Reviewed: 09/22/2010 [facility] Patient Information 2015 [place], [place]. This information is not intended to replace advice given to you by your health care provider. Make sure you discuss any questions you have with your health care provider.

## 2015-01-23 NOTE — ED Notes (Signed)
Pt reports having a dental abscess x 3 days and emesis x 1 day.  Pain score 10/10.  Pt reports that she has been seen by a dentist and prescribed Prednisone, Augmentin, and Toradol.  Sts "I can't keep any of the medications down."

## 2015-01-23 NOTE — ED Provider Notes (Addendum)
CSN: [id number]     Arrival date & time 01/23/15  9629 History   None    Chief Complaint  Patient presents with  . Abscess  . Emesis     (Consider location/radiation/quality/duration/timing/severity/associated sxs/prior Treatment) HPI Complains of vomiting for the past 3 days. Patient noted by Dr. Edsel Petrin at office visit this week to have abscess of tooth. She saw Dr. Lilia Pro, endodontist yesterday prescribed Toradol, prednisone, Augmentin has been unable to hold down any medications. She complains of pain around teeth numbers 5 and 6. She admits to chills. No nausea present. She's been on hold down medications prescribed to her i.e. Toradol, prednisone, Augmentin. No other associated symptoms. Denies hematemesis. No other associated symptoms History reviewed. No pertinent past medical history. Past Surgical History  Procedure Laterality Date  . Cystoscopy with ureteroscopy and stent placement     History reviewed. No pertinent family history. History  Substance Use Topics  . Smoking status: Former Research scientist (life sciences)  . Smokeless tobacco: Not on file  . Alcohol Use: No   OB History    No data available     Review of Systems  Constitutional: Negative.   HENT: Positive for dental problem and facial swelling.   Respiratory: Negative.   Cardiovascular: Negative.   Gastrointestinal: Positive for nausea and vomiting.  Musculoskeletal: Negative.   Skin: Negative.   Neurological: Negative.   Psychiatric/Behavioral: Negative.   All other systems reviewed and are negative.     Allergies  Aspirin and Sulfonamide derivatives  Home Medications   Prior to Admission medications   Not on File   BP 142/94 mmHg  Pulse 84  Temp(Src) 98.2 F (36.8 C) (Oral)  Resp 16  SpO2 96%  LMP 01/17/2015 Physical Exam  Constitutional: She appears well-developed and well-nourished. She appears distressed.  Appears mildly uncomfortable  HENT:  Head: Normocephalic and atraumatic.  Mild swelling at  right maxillary area. Tender over teeth #5 and 6. No fluctuance of gingiva. Mucous members dry  Eyes: Conjunctivae are normal. Pupils are equal, round, and reactive to light.  Neck: Neck supple. No tracheal deviation present. No thyromegaly present.  Cardiovascular: Normal rate and regular rhythm.   No murmur heard. Pulmonary/Chest: Effort normal and breath sounds normal.  Abdominal: Soft. Bowel sounds are normal. She exhibits no distension. There is no tenderness.  Musculoskeletal: Normal range of motion. She exhibits no edema or tenderness.  Neurological: She is alert. Coordination normal.  Skin: Skin is warm and dry. No rash noted.  Psychiatric: She has a normal mood and affect.  Nursing note and vitals reviewed.   ED Course  Procedures (including critical care time) Labs Review Labs Reviewed - No data to display  Imaging Review No results found.   EKG Interpretation None     patient feels improved she is able to drink pain is improved after treatment with intravenous opiates antiemetics and intravenous fluids Results for orders placed or performed during the hospital encounter of 01/23/15  CBC with Differential/Platelet  Result Value Ref Range   WBC 11.7 (H) 4.0 - 10.5 K/uL   RBC 4.68 3.87 - 5.11 MIL/uL   Hemoglobin 13.7 12.0 - 15.0 g/dL   HCT 41.3 36.0 - 46.0 %   MCV 88.2 78.0 - 100.0 fL   MCH 29.3 26.0 - 34.0 pg   MCHC 33.2 30.0 - 36.0 g/dL   RDW 13.3 11.5 - 15.5 %   Platelets 371 150 - 400 K/uL   Neutrophils Relative % 80 (H) 43 - 77 %  Neutro Abs 9.3 (H) 1.7 - 7.7 K/uL   Lymphocytes Relative 13 12 - 46 %   Lymphs Abs 1.5 0.7 - 4.0 K/uL   Monocytes Relative 7 3 - 12 %   Monocytes Absolute 0.8 0.1 - 1.0 K/uL   Eosinophils Relative 0 0 - 5 %   Eosinophils Absolute 0.1 0.0 - 0.7 K/uL   Basophils Relative 0 0 - 1 %   Basophils Absolute 0.0 0.0 - 0.1 K/uL  I-stat chem 8, ed  Result Value Ref Range   Sodium 139 135 - 145 mmol/L   Potassium 3.7 3.5 - 5.1 mmol/L    Chloride 102 101 - 111 mmol/L   BUN 25 (H) 6 - 20 mg/dL   Creatinine, Ser 0.70 0.44 - 1.00 mg/dL   Glucose, Bld 104 (H) 65 - 99 mg/dL   Calcium, Ion 1.16 1.12 - 1.23 mmol/L   TCO2 25 0 - 100 mmol/L   Hemoglobin 15.0 12.0 - 15.0 g/dL   HCT 44.0 36.0 - 46.0 %   No results found.   MDM  Spoke with Dr. organ, and the [facility]. We will discontinue Toradol. Prescriptions for Percocet and Zofran. She has scheduled appointment with oral surgeon Dr. Loyal Gambler for 2 PM today Final diagnoses:  None   diagnoses #1dental abscess #2 nausea and vomiting #3 elevated blood pressure      Orlie Dakin, MD 01/23/15 1256  Orlie Dakin, MD 01/23/15 1259

## 2016-12-02 ENCOUNTER — Other Ambulatory Visit: Payer: Self-pay | Admitting: Obstetrics and Gynecology

## 2016-12-02 DIAGNOSIS — Z1231 Encounter for screening mammogram for malignant neoplasm of breast: Secondary | ICD-10-CM

## 2016-12-03 ENCOUNTER — Ambulatory Visit
Admission: RE | Admit: 2016-12-03 | Discharge: 2016-12-03 | Disposition: A | Discharge status: Home / Self Care | Payer: 59 | Source: Ambulatory Visit | Attending: Obstetrics and Gynecology | Admitting: Obstetrics and Gynecology

## 2016-12-03 DIAGNOSIS — Z1231 Encounter for screening mammogram for malignant neoplasm of breast: Secondary | ICD-10-CM

## 2018-08-16 ENCOUNTER — Other Ambulatory Visit: Payer: Self-pay | Admitting: Obstetrics and Gynecology

## 2018-08-16 ENCOUNTER — Ambulatory Visit
Admission: RE | Admit: 2018-08-16 | Discharge: 2018-08-16 | Disposition: A | Discharge status: Home / Self Care | Payer: PRIVATE HEALTH INSURANCE | Source: Ambulatory Visit | Attending: Obstetrics and Gynecology | Admitting: Obstetrics and Gynecology

## 2018-08-16 DIAGNOSIS — Z1231 Encounter for screening mammogram for malignant neoplasm of breast: Secondary | ICD-10-CM

## 2019-09-03 ENCOUNTER — Other Ambulatory Visit: Payer: Self-pay | Admitting: Obstetrics and Gynecology

## 2019-09-03 DIAGNOSIS — Z1231 Encounter for screening mammogram for malignant neoplasm of breast: Secondary | ICD-10-CM

## 2019-09-24 ENCOUNTER — Other Ambulatory Visit: Payer: Self-pay

## 2019-09-24 ENCOUNTER — Ambulatory Visit
Admission: RE | Admit: 2019-09-24 | Discharge: 2019-09-24 | Disposition: A | Discharge status: Home / Self Care | Payer: Managed Care, Other (non HMO) | Source: Ambulatory Visit | Attending: Obstetrics and Gynecology | Admitting: Obstetrics and Gynecology

## 2019-09-24 DIAGNOSIS — Z1231 Encounter for screening mammogram for malignant neoplasm of breast: Secondary | ICD-10-CM

## 2020-11-25 ENCOUNTER — Ambulatory Visit
Admission: RE | Admit: 2020-11-25 | Discharge: 2020-11-25 | Disposition: A | Discharge status: Home / Self Care | Payer: Managed Care, Other (non HMO) | Source: Ambulatory Visit | Attending: Physician Assistant | Admitting: Physician Assistant

## 2020-11-25 ENCOUNTER — Other Ambulatory Visit: Payer: Self-pay

## 2020-11-25 ENCOUNTER — Other Ambulatory Visit: Payer: Self-pay | Admitting: Urology

## 2020-11-25 ENCOUNTER — Other Ambulatory Visit: Payer: Self-pay | Admitting: Physician Assistant

## 2020-11-25 DIAGNOSIS — Z1231 Encounter for screening mammogram for malignant neoplasm of breast: Secondary | ICD-10-CM

## 2021-12-05 ENCOUNTER — Other Ambulatory Visit: Payer: Self-pay

## 2021-12-05 ENCOUNTER — Emergency Department (HOSPITAL_COMMUNITY)
Admission: EM | Admit: 2021-12-05 | Discharge: 2021-12-05 | Disposition: A | Discharge status: Home / Self Care | Payer: Managed Care, Other (non HMO) | Attending: Emergency Medicine | Admitting: Emergency Medicine

## 2021-12-05 ENCOUNTER — Emergency Department (HOSPITAL_COMMUNITY): Payer: Managed Care, Other (non HMO)

## 2021-12-05 DIAGNOSIS — M549 Dorsalgia, unspecified: Secondary | ICD-10-CM | POA: Insufficient documentation

## 2021-12-05 DIAGNOSIS — S161XXA Strain of muscle, fascia and tendon at neck level, initial encounter: Secondary | ICD-10-CM | POA: Diagnosis not present

## 2021-12-05 DIAGNOSIS — Y92410 Unspecified street and highway as the place of occurrence of the external cause: Secondary | ICD-10-CM | POA: Diagnosis not present

## 2021-12-05 DIAGNOSIS — S0990XA Unspecified injury of head, initial encounter: Secondary | ICD-10-CM | POA: Insufficient documentation

## 2021-12-05 DIAGNOSIS — S199XXA Unspecified injury of neck, initial encounter: Secondary | ICD-10-CM | POA: Diagnosis present

## 2021-12-05 MED ORDER — DIPHENHYDRAMINE HCL 25 MG PO CAPS
25.0000 mg | ORAL_CAPSULE | Freq: Once | ORAL | Status: AC
  Administered 2021-12-05: 25 mg via ORAL
  Filled 2021-12-05: qty 1

## 2021-12-05 MED ORDER — ACETAMINOPHEN 325 MG PO TABS
650.0000 mg | ORAL_TABLET | Freq: Once | ORAL | Status: AC
Start: 2021-12-05 — End: 2021-12-05
  Administered 2021-12-05: 650 mg via ORAL
  Filled 2021-12-05: qty 2

## 2021-12-05 MED ORDER — METHOCARBAMOL 500 MG PO TABS: 500.0000 mg | ORAL_TABLET | Freq: Two times a day (BID) | ORAL | 0 refills | Status: AC

## 2021-12-05 MED ORDER — METHOCARBAMOL 500 MG PO TABS: 500.0000 mg | ORAL_TABLET | Freq: Two times a day (BID) | ORAL | 0 refills | Status: DC

## 2021-12-05 MED ORDER — METOCLOPRAMIDE HCL 10 MG PO TABS
10.0000 mg | ORAL_TABLET | Freq: Once | ORAL | Status: AC
  Administered 2021-12-05: 10 mg via ORAL
  Filled 2021-12-05: qty 1

## 2021-12-05 MED ORDER — IBUPROFEN 400 MG PO TABS
600.0000 mg | ORAL_TABLET | Freq: Once | ORAL | Status: AC
  Administered 2021-12-05: 600 mg via ORAL
  Filled 2021-12-05: qty 1
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
## 2021-12-05 NOTE — ED Notes (Signed)
Patient transported to CT 

## 2021-12-21 ENCOUNTER — Other Ambulatory Visit: Payer: Self-pay | Admitting: Internal Medicine

## 2021-12-21 DIAGNOSIS — M5412 Radiculopathy, cervical region: Secondary | ICD-10-CM

## 2021-12-22 ENCOUNTER — Ambulatory Visit
Admission: RE | Admit: 2021-12-22 | Discharge: 2021-12-22 | Disposition: A | Discharge status: Home / Self Care | Payer: Managed Care, Other (non HMO) | Source: Ambulatory Visit | Attending: Internal Medicine | Admitting: Internal Medicine

## 2021-12-22 ENCOUNTER — Other Ambulatory Visit: Payer: Managed Care, Other (non HMO)

## 2021-12-22 DIAGNOSIS — M5412 Radiculopathy, cervical region: Secondary | ICD-10-CM

## 2022-03-23 ENCOUNTER — Other Ambulatory Visit: Payer: Self-pay | Admitting: Obstetrics & Gynecology

## 2022-03-23 ENCOUNTER — Ambulatory Visit
Admission: RE | Admit: 2022-03-23 | Discharge: 2022-03-23 | Disposition: A | Discharge status: Home / Self Care | Payer: Managed Care, Other (non HMO) | Source: Ambulatory Visit | Attending: Obstetrics & Gynecology | Admitting: Obstetrics & Gynecology

## 2022-03-23 DIAGNOSIS — Z1231 Encounter for screening mammogram for malignant neoplasm of breast: Secondary | ICD-10-CM

## 2022-05-03 ENCOUNTER — Other Ambulatory Visit: Payer: Self-pay

## 2022-05-03 ENCOUNTER — Encounter (HOSPITAL_COMMUNITY): Payer: Self-pay | Admitting: Emergency Medicine

## 2022-05-03 ENCOUNTER — Emergency Department (HOSPITAL_COMMUNITY)
Admission: EM | Admit: 2022-05-03 | Discharge: 2022-05-03 | Disposition: A | Discharge status: Home / Self Care | Payer: Managed Care, Other (non HMO) | Attending: Emergency Medicine | Admitting: Emergency Medicine

## 2022-05-03 ENCOUNTER — Emergency Department (HOSPITAL_COMMUNITY): Payer: Managed Care, Other (non HMO)

## 2022-05-03 DIAGNOSIS — R1032 Left lower quadrant pain: Secondary | ICD-10-CM | POA: Diagnosis present

## 2022-05-03 LAB — CBC WITH DIFFERENTIAL/PLATELET
Abs Immature Granulocytes: 0.02 10*3/uL (ref 0.00–0.07)
Basophils Absolute: 0.1 10*3/uL (ref 0.0–0.1)
Basophils Relative: 1 %
Eosinophils Absolute: 0.2 10*3/uL (ref 0.0–0.5)
Eosinophils Relative: 4 %
HCT: 39 % (ref 36.0–46.0)
Hemoglobin: 12.6 g/dL (ref 12.0–15.0)
Immature Granulocytes: 0 %
Lymphocytes Relative: 32 %
Lymphs Abs: 2.1 10*3/uL (ref 0.7–4.0)
MCH: 29.3 pg (ref 26.0–34.0)
MCHC: 32.3 g/dL (ref 30.0–36.0)
MCV: 90.7 fL (ref 80.0–100.0)
Monocytes Absolute: 0.4 10*3/uL (ref 0.1–1.0)
Monocytes Relative: 7 %
Neutro Abs: 3.8 10*3/uL (ref 1.7–7.7)
Neutrophils Relative %: 56 %
Platelets: 394 10*3/uL (ref 150–400)
RBC: 4.3 MIL/uL (ref 3.87–5.11)
RDW: 13.7 % (ref 11.5–15.5)
WBC: 6.7 10*3/uL (ref 4.0–10.5)
nRBC: 0 % (ref 0.0–0.2)

## 2022-05-03 LAB — URINALYSIS, ROUTINE W REFLEX MICROSCOPIC
Bilirubin Urine: NEGATIVE
Glucose, UA: NEGATIVE mg/dL
Hgb urine dipstick: NEGATIVE
Ketones, ur: NEGATIVE mg/dL
Leukocytes,Ua: NEGATIVE
Nitrite: NEGATIVE
Protein, ur: NEGATIVE mg/dL
Specific Gravity, Urine: 1.023 (ref 1.005–1.030)
pH: 5 (ref 5.0–8.0)

## 2022-05-03 LAB — COMPREHENSIVE METABOLIC PANEL
ALT: 14 U/L (ref 0–44)
AST: 17 U/L (ref 15–41)
Albumin: 3.4 g/dL — ABNORMAL LOW (ref 3.5–5.0)
Alkaline Phosphatase: 57 U/L (ref 38–126)
Anion gap: 7 (ref 5–15)
BUN: 17 mg/dL (ref 6–20)
CO2: 19 mmol/L — ABNORMAL LOW (ref 22–32)
Calcium: 8.7 mg/dL — ABNORMAL LOW (ref 8.9–10.3)
Chloride: 107 mmol/L (ref 98–111)
Creatinine, Ser: 0.96 mg/dL (ref 0.44–1.00)
GFR, Estimated: >60 mL/min (ref >60)
Glucose, Bld: 101 mg/dL — ABNORMAL HIGH (ref 70–99)
Potassium: 3.7 mmol/L (ref 3.5–5.1)
Sodium: 133 mmol/L — ABNORMAL LOW (ref 135–145)
Total Bilirubin: 0.3 mg/dL (ref 0.3–1.2)
Total Protein: 6.6 g/dL (ref 6.5–8.1)

## 2022-05-03 LAB — I-STAT BETA HCG BLOOD, ED (MC, WL, AP ONLY): I-stat hCG, quantitative: <5 m[IU]/mL (ref <5)

## 2022-05-03 LAB — LIPASE, BLOOD: Lipase: 33 U/L (ref 11–51)

## 2022-05-03 MED ORDER — IOHEXOL 350 MG/ML SOLN
100.0000 mL | Freq: Once | INTRAVENOUS | Status: AC | PRN
Start: 2022-05-03 — End: 2022-05-03
  Administered 2022-05-03: 75 mL via INTRAVENOUS

## 2022-05-03 MED ORDER — SENNOSIDES-DOCUSATE SODIUM 8.6-50 MG PO TABS: 2.0000 | ORAL_TABLET | Freq: Every day | ORAL | 0 refills | Status: AC

## 2022-05-03 MED ORDER — MAGNESIUM CITRATE PO SOLN: 0.5000 | Freq: Every day | ORAL | 0 refills | Status: AC

## 2022-05-03 MED ORDER — OXYCODONE-ACETAMINOPHEN 5-325 MG PO TABS
1.0000 | ORAL_TABLET | Freq: Once | ORAL | Status: AC
  Administered 2022-05-03: 1 via ORAL
  Filled 2022-05-03: qty 1

## 2022-05-03 MED ORDER — HYDROCODONE-ACETAMINOPHEN 5-325 MG PO TABS
1.0000 | ORAL_TABLET | Freq: Once | ORAL | Status: AC
  Administered 2022-05-03: 1 via ORAL
  Filled 2022-05-03: qty 1

## 2022-05-03 NOTE — ED Triage Notes (Signed)
Pt reports left side pain that started hurting on Friday.  Pt denies n/v/d, trouble w/ urination, but did state she has had indigestion.  Pt reports normal bowel movements but feels bloated.

## 2022-05-03 NOTE — ED Provider Notes (Signed)
[facility] EMERGENCY DEPARTMENT Provider Note   CSN: [id number] Arrival date & time: 05/03/22  0037     History  Chief Complaint  Patient presents with   left sided pain    [name] is a 51 y.o. female.  HPI Patient presents with left-sided abdominal pain.  Patient has multiple medical problems including cystocele, vaginal prolapse, and cervical spine fusion earlier this year.  She notes that over the past few days she has felt pain in the left lower quadrant, without vomiting, without nausea, and with ongoing bowel movements.  No fevers, chills.    Home Medications Prior to Admission medications   Medication Sig Start Date End Date Taking? Authorizing Provider  magnesium citrate SOLN Take 148 mLs (0.5 Bottles total) by mouth daily for 7 days. 05/03/22 05/10/22 Yes Carmin Muskrat, MD  senna-docusate (SENOKOT-S) 8.6-50 MG tablet Take 2 tablets by mouth daily for 10 days. 05/03/22 05/13/22 Yes Carmin Muskrat, MD  amoxicillin-clavulanate (AUGMENTIN) 500-125 MG per tablet Take 1 tablet by mouth 3 (three) times daily.    [provider]  ketorolac (TORADOL) 10 MG tablet Take 10 mg by mouth every 6 (six) hours as needed for moderate pain.    [provider]  methocarbamol (ROBAXIN) 500 MG tablet Take 1 tablet (500 mg total) by mouth 2 (two) times daily. 12/05/21   Godfrey Pick, MD  ondansetron (ZOFRAN) 8 MG tablet Take 1 tablet (8 mg total) by mouth every 8 (eight) hours as needed for nausea or vomiting. 01/23/15   Orlie Dakin, MD  oxyCODONE-acetaminophen (PERCOCET) 5-325 MG per tablet Take 1-2 tablets by mouth every 6 (six) hours as needed for severe pain. 01/23/15   Orlie Dakin, MD  PrednisoLONE 5 MG (21) TBPK Take 5 mg by mouth See admin instructions. 6 day pack (21 tabs)    [provider]      Allergies    Aspirin and Sulfonamide derivatives    Review of Systems   Review of Systems  All other systems reviewed and are  negative.   Physical Exam Updated Vital Signs BP 116/87   Pulse 77   Temp 97.8 F (36.6 C) (Oral)   Resp 16   Ht '5\' 6"'$  (1.676 m)   Wt 63.4 kg   SpO2 100%   BMI 22.56 kg/m  Physical Exam Vitals and nursing note reviewed.  Constitutional:      General: She is not in acute distress.    Appearance: She is well-developed.  HENT:     Head: Normocephalic and atraumatic.  Eyes:     Conjunctiva/sclera: Conjunctivae normal.  Cardiovascular:     Rate and Rhythm: Normal rate and regular rhythm.  Pulmonary:     Effort: Pulmonary effort is normal. No respiratory distress.     Breath sounds: Normal breath sounds. No stridor.  Abdominal:     Comments: Protuberant abdomen  Skin:    General: Skin is warm and dry.  Neurological:     Mental Status: She is alert and oriented to person, place, and time.     Cranial Nerves: No cranial nerve deficit.  Psychiatric:        Mood and Affect: Mood normal.     ED Results / Procedures / Treatments   Labs (all labs ordered are listed, but only abnormal results are displayed) Labs Reviewed  COMPREHENSIVE METABOLIC PANEL - Abnormal; Notable for the following components:      Result Value   Sodium 133 (*)  CO2 19 (*)    Glucose, Bld 101 (*)    Calcium 8.7 (*)    Albumin 3.4 (*)    All other components within normal limits  URINALYSIS, ROUTINE W REFLEX MICROSCOPIC - Abnormal; Notable for the following components:   APPearance HAZY (*)    Bacteria, UA RARE (*)    All other components within normal limits  CBC WITH DIFFERENTIAL/PLATELET  LIPASE, BLOOD  I-STAT BETA HCG BLOOD, ED ([facility], [facility], [facility] ONLY)    EKG None  Radiology CT ABDOMEN PELVIS W CONTRAST  Result Date: 05/03/2022 CLINICAL DATA:  Left lower quadrant abdominal pain EXAM: CT ABDOMEN AND PELVIS WITH CONTRAST TECHNIQUE: Multidetector CT imaging of the abdomen and pelvis was performed using the standard protocol following bolus administration of intravenous contrast. RADIATION DOSE  REDUCTION: This exam was performed according to the departmental dose-optimization program which includes automated exposure control, adjustment of the mA and/or kV according to patient size and/or use of iterative reconstruction technique. CONTRAST:  8m OMNIPAQUE IOHEXOL 350 MG/ML SOLN COMPARISON:  03/26/2009 FINDINGS: Lower chest: Normal Hepatobiliary: Liver parenchyma is normal.  No calcified gallstones. Pancreas: Normal Spleen: Normal Adrenals/Urinary Tract: Adrenal glands are normal. Right kidney is normal except for a 2 mm nonobstructing stone in the lower pole. Two small nonobstructing stones in the left kidney, the larger in the lower pole measuring 2.5 mm. No hydronephrosis or passing stone. The bladder is normal. Stomach/Bowel: Small hiatal hernia. Stomach otherwise normal. Small bowel pattern is normal. Normal appendix. Patient does have a large amount of fecal matter throughout the colon. No evidence diverticulosis or diverticulitis. Vascular/Lymphatic: Aorta appears normal. IVC is normal. No adenopathy. Reproductive: Pessary in place.  No pelvic mass. Other: No free fluid or air. Musculoskeletal: Mild lower lumbar degenerative changes. IMPRESSION: 1. Large amount of fecal matter throughout the colon. No evidence of diverticulosis or diverticulitis. 2. Small hiatal hernia. 3. Small nonobstructing stones in both kidneys. No hydronephrosis or passing stone. Electronically Signed   By: MNelson ChimesM.D.   On: 05/03/2022 07:53    Procedures Procedures    Medications Ordered in ED Medications  oxyCODONE-acetaminophen (PERCOCET/ROXICET) 5-325 MG per tablet 1 tablet (1 tablet Oral Given 05/03/22 0148)  iohexol (OMNIPAQUE) 350 MG/ML injection 100 mL (75 mLs Intravenous Contrast Given 05/03/22 0744)    ED Course/ Medical Decision Making/ A&P This patient with a Hx of medical issues including prior C-spine fusion, cystocele, prior narcotic pain medication regimen presents to the ED for concern of  abdominal pain, this involves an extensive number of treatment options, and is a complaint that carries with it a high risk of complications and morbidity.    The differential diagnosis includes obstruction, infection, kidney stone, mass, complication of her pessary   Social Determinants of Health:  No limits  Additional history obtained:  Additional history and/or information obtained from chart review, notable for ongoing evaluation with urology/gynecology for her issues   After the initial evaluation, orders, including: CT labs from triage were initiated.   Patient placed on Cardiac and Pulse-Oximetry Monitors. The patient was maintained on a cardiac monitor.  The cardiac monitored showed an rhythm of 75 sinus normal The patient was also maintained on pulse oximetry. The readings were typically 100% room air normal   On repeat evaluation of the patient stayed the same  Lab Tests:  I personally interpreted labs.  The pertinent results include: Unremarkable labs  Imaging Studies ordered:  I independently visualized and interpreted imaging which showed no obstruction, substantial  amounts of stool throughout the colon I agree with the radiologist interpretation   Dispostion / Final MDM:  After consideration of the diagnostic results and the patient's response to treatment, adult female with multiple medical issues presents with abdominal pain.  She is awake, alert, with a nonperitoneal abdomen, no fever, no evidence for bacteremia, sepsis.  Patient's abdomen is distended, consistent with CT demonstrating substantial stool burden, but no evidence for obstruction, some suspicion for medication related ileus.  Hospitalization was a consideration, but the patient is appropriate for initiation of stool softener regimen.  No early evidence for concurrent phenomena.  However, given the patient's abnormal colposcopy which she discussed with me she will also follow-up with  gynecology.  Final Clinical Impression(s) / ED Diagnoses Final diagnoses:  Left lower quadrant abdominal pain    Rx / DC Orders ED Discharge Orders          Ordered    senna-docusate (SENOKOT-S) 8.6-50 MG tablet  Daily        05/03/22 1259    magnesium citrate SOLN  Daily        05/03/22 1259              Carmin Muskrat, MD 05/03/22 1448

## 2022-05-03 NOTE — Discharge Instructions (Addendum)
As discussed, your evaluation today has been largely reassuring.  But, it is important that you monitor your condition carefully, and do not hesitate to return to the ED if you develop new, or concerning changes in your condition.  Please schedule follow-up with our gastroenterology colleagues and your gynecologist for appropriate ongoing care.

## 2022-05-03 NOTE — ED Provider Triage Note (Signed)
Emergency Medicine Provider Triage Evaluation Note  [name] , a 51 y.o. female  was evaluated in triage.  Pt complains of left lower abdominal pain progressively worsening for the last 48 hours.  No other symptoms aside from reflux.  Normal bowel movements and urination..  Review of Systems  Positive: Abdominal pain and distention Negative: Nausea vomiting diarrhea fevers chills dysuria  Physical Exam  BP 120/88   Pulse 93   Temp 98 F (36.7 C) (Oral)   Resp 16   Ht '5\' 6"'$  (1.676 m)   Wt 63.4 kg   SpO2 97%   BMI 22.56 kg/m  Gen:   Awake, no distress   Resp:  Normal effort  MSK:   Moves extremities without difficulty  Other:  Left upper and lower quadrant tenderness palpation.  No CVAT.  Abdomen does appear mildly distended though soft  Medical Decision Making  Medically screening exam initiated at 1:43 AM.  Appropriate orders placed.  [name] was informed that the remainder of the evaluation will be completed by another provider, this initial triage assessment does not replace that evaluation, and the importance of remaining in the ED until their evaluation is complete.  This chart was dictated using voice recognition software, Dragon. Despite the best efforts of this provider to proofread and correct errors, errors may still occur which can change documentation meaning.    Emeline Darling, PA-C 05/03/22 [phone]

## 2022-06-15 ENCOUNTER — Other Ambulatory Visit: Payer: Self-pay | Admitting: Student

## 2022-06-15 DIAGNOSIS — M4802 Spinal stenosis, cervical region: Secondary | ICD-10-CM

## 2022-06-18 ENCOUNTER — Ambulatory Visit
Admission: RE | Admit: 2022-06-18 | Discharge: 2022-06-18 | Disposition: A | Discharge status: Home / Self Care | Payer: Managed Care, Other (non HMO) | Source: Ambulatory Visit | Attending: Student | Admitting: Student

## 2022-06-18 DIAGNOSIS — M4802 Spinal stenosis, cervical region: Secondary | ICD-10-CM

## 2022-09-06 ENCOUNTER — Other Ambulatory Visit: Payer: Self-pay | Admitting: Obstetrics & Gynecology

## 2022-09-06 DIAGNOSIS — R102 Pelvic and perineal pain: Secondary | ICD-10-CM

## 2022-09-28 ENCOUNTER — Ambulatory Visit
Admission: RE | Admit: 2022-09-28 | Discharge: 2022-09-28 | Disposition: A | Discharge status: Home / Self Care | Payer: Managed Care, Other (non HMO) | Source: Ambulatory Visit | Attending: Obstetrics & Gynecology | Admitting: Obstetrics & Gynecology

## 2022-09-28 DIAGNOSIS — R102 Pelvic and perineal pain: Secondary | ICD-10-CM

## 2022-10-01 ENCOUNTER — Other Ambulatory Visit: Payer: Managed Care, Other (non HMO)

## 2023-01-17 ENCOUNTER — Ambulatory Visit
Admission: RE | Admit: 2023-01-17 | Discharge: 2023-01-17 | Disposition: A | Discharge status: Home / Self Care | Payer: Managed Care, Other (non HMO) | Source: Ambulatory Visit | Attending: Obstetrics & Gynecology | Admitting: Obstetrics & Gynecology

## 2023-01-17 ENCOUNTER — Other Ambulatory Visit: Payer: Self-pay | Admitting: Obstetrics & Gynecology

## 2023-01-17 DIAGNOSIS — M79622 Pain in left upper arm: Secondary | ICD-10-CM

## 2023-04-29 ENCOUNTER — Ambulatory Visit
Admission: RE | Admit: 2023-04-29 | Discharge: 2023-04-29 | Disposition: A | Discharge status: Home / Self Care | Payer: Managed Care, Other (non HMO) | Source: Ambulatory Visit | Attending: Obstetrics & Gynecology | Admitting: Obstetrics & Gynecology

## 2023-04-29 ENCOUNTER — Other Ambulatory Visit: Payer: Self-pay | Admitting: Obstetrics & Gynecology

## 2023-04-29 DIAGNOSIS — Z1231 Encounter for screening mammogram for malignant neoplasm of breast: Secondary | ICD-10-CM

## 2023-07-14 ENCOUNTER — Other Ambulatory Visit: Payer: Self-pay | Admitting: Internal Medicine

## 2023-07-14 ENCOUNTER — Encounter: Payer: Self-pay | Admitting: Internal Medicine

## 2023-07-14 DIAGNOSIS — M79602 Pain in left arm: Secondary | ICD-10-CM

## 2023-07-15 ENCOUNTER — Other Ambulatory Visit: Payer: Managed Care, Other (non HMO)

## 2023-08-26 ENCOUNTER — Other Ambulatory Visit: Payer: Self-pay | Admitting: Student

## 2023-08-26 DIAGNOSIS — G8929 Other chronic pain: Secondary | ICD-10-CM

## 2023-08-26 DIAGNOSIS — M542 Cervicalgia: Secondary | ICD-10-CM

## 2023-09-08 ENCOUNTER — Ambulatory Visit
Admission: RE | Admit: 2023-09-08 | Discharge: 2023-09-08 | Disposition: A | Discharge status: Home / Self Care | Source: Ambulatory Visit | Attending: Student | Admitting: Student

## 2023-09-08 DIAGNOSIS — G8929 Other chronic pain: Secondary | ICD-10-CM

## 2023-09-08 DIAGNOSIS — M542 Cervicalgia: Secondary | ICD-10-CM

## 2023-09-11 ENCOUNTER — Other Ambulatory Visit: Payer: Managed Care, Other (non HMO)
